# Patient Record
Sex: Female | Born: 1955 | Race: White | Hispanic: No | Marital: Married | Smoking: Former smoker
Health system: Southern US, Community
[De-identification: ages and names within clinical notes are randomized; demographics above are authoritative.]

## PROBLEM LIST (undated history)

## (undated) DIAGNOSIS — I10 Essential (primary) hypertension: Secondary | ICD-10-CM

## (undated) HISTORY — DX: Essential (primary) hypertension: I10

---

## 2001-09-23 ENCOUNTER — Other Ambulatory Visit: Admission: RE | Admit: 2001-09-23 | Discharge: 2001-09-23 | Payer: Self-pay | Admitting: Unknown Physician Specialty

## 2021-09-15 NOTE — Progress Notes (Signed)
?Cardiology Office Note:   ? ?Date:  09/19/2021  ? ?ID:  Jill Travis, DOB 1956/02/04, MRN [id number] ? ?PCP:  Lennox Pippinslark, Rhonda K, NP  ?Cardiologist:  None   ? ?Referring MD: Lennox Pippinslark, Rhonda K, NP  ? ?No chief complaint on file. ? ? ?History of Present Illness:   ? ?Jill Travis is a 66 y.o. female with a hx of hypertension, morbid obesity, and anxiety here today for the evaluation of leaky heart valve.  ? ?Today, she is doing well. She was told she had a leaking heart valve about 2 years ago. The valve was found by Dr. Michel SanteeFavero and Legrand PittsWilliam Shough, [place] in [place] via ultrasound due to her family history of heart disease and shortness of breath. Her mother had diabetes, COPD, and kidney failure with 6 stents. Her shortness of breath started about 2 years ago and has been progressively worsening. Reportedly, she also underwent stress tests for her shortness of breath. Minimal exertion like walking causes her to become winded. She also had L arm pain that she was told was not related to her heart. Currently, the pain is not as bad as it used to be. She endorses constant fatigue. She uses Ozempic and lost 33 lbs since February. She reports her diet is good but she has difficulty exercising due to the shortness of breath constant fatigue. She has performed a sleep study in the past and was told she does not need a CPAP machine. Recently, a nodule was found on her thyroid that is being monitored. She denies any palpitations, chest pain, lightheadedness, headaches, syncope, orthopnea, PND, or lower extremity edema. ? ?Past Medical History:  ?Diagnosis Date  ? Essential hypertension 09/19/2021  ? Fatigue 09/19/2021  ? Hypertension   ? Shortness of breath 09/19/2021  ? ? ?History reviewed. No pertinent surgical history. ? ?Current Medications: ?Current Meds  ?Medication Sig  ? aspirin 325 MG EC tablet Take 1 tablet by mouth daily.  ? Calcium-Magnesium-Zinc 333-133-5 MG TABS Take 1 tablet by mouth daily.  ? metoprolol  tartrate (LOPRESSOR) 100 MG tablet TAKE 1 TABLET 2 HOURS PRIOR TO CT  ? OZEMPIC, 1 MG/DOSE, 4 MG/3ML SOPN Inject 1 mg into the skin once a week.  ? triamcinolone ointment (KENALOG) 0.1 % PLEASE SEE ATTACHED FOR DETAILED DIRECTIONS  ? valsartan-hydrochlorothiazide (DIOVAN-HCT) 160-12.5 MG tablet Take 1 tablet by mouth daily.  ? venlafaxine (EFFEXOR) 75 MG tablet Take 75 mg by mouth daily.  ? Vitamin D, Ergocalciferol, (DRISDOL) 1.25 MG (50000 UNIT) CAPS capsule Take 50,000 Units by mouth once a week.  ?  ? ?Allergies:   Latex  ? ?Social History  ? ?Socioeconomic History  ? Marital status: Married  ?  Spouse name: Not on file  ? Number of children: Not on file  ? Years of education: Not on file  ? Highest education level: Not on file  ?Occupational History  ? Not on file  ?Tobacco Use  ? Smoking status: Former  ?  Types: Cigarettes  ?  Quit date: 241981  ?  Years since quitting: 42.3  ? Smokeless tobacco: Never  ?Substance and Sexual Activity  ? Alcohol use: Not Currently  ? Drug use: Never  ? Sexual activity: Not on file  ?Other Topics Concern  ? Not on file  ?Social History Narrative  ? Not on file  ? ?Social Determinants of Health  ? ?Financial Resource Strain: Low Risk   ? Difficulty of Paying Living Expenses: Not hard at all  ?  Food Insecurity: No Food Insecurity  ? Worried About Programme researcher, broadcasting/film/video in the Last Year: Never true  ? Ran Out of Food in the Last Year: Never true  ?Transportation Needs: No Transportation Needs  ? Lack of Transportation (Medical): No  ? Lack of Transportation (Non-Medical): No  ?Physical Activity: Inactive  ? Days of Exercise per Week: 0 days  ? Minutes of Exercise per Session: 0 min  ?Stress: Not on file  ?Social Connections: Not on file  ?  ? ?Family History: ?The patient's family history includes Asthma in her mother; COPD in her mother; Cancer in her maternal grandmother; Diabetes in her mother; Heart attack in her father and mother; Hyperlipidemia in her mother; Kidney disease in her  mother; Parkinson's disease in her father; Stroke in her maternal grandmother; Thyroid disease in her mother. ? ?ROS:   ?Please see the history of present illness.    ?(+) Shortness of breath ?(+) L arm pain ?(+) Fatigue/Malaise ?All other systems reviewed and negative.  ? ?EKGs/Labs/Other Studies Reviewed:   ? ?The following studies were reviewed today: ?No prior cardiovascular studies available ? ?EKG:   ?09/19/21: Sinus rhythm rate 91 bpm ? ?Recent Labs: ?No results found for requested labs within last 8760 hours.  ? ?Recent Lipid Panel ?No results found for: CHOL, TRIG, HDL, CHOLHDL, VLDL, LDLCALC, LDLDIRECT ? ? ?Physical Exam:   ?VS:  BP 100/70 (BP Location: Right Arm, Patient Position: Sitting, Cuff Size: Large)   Pulse 91   Ht 5\' 7"  (1.702 m)   Wt 217 lb (98.4 kg)   BMI 33.99 kg/m?  , BMI Body mass index is 33.99 kg/m?. ?GENERAL:  Well appearing ?HEENT: Pupils equal round and reactive, fundi not visualized, oral mucosa unremarkable ?NECK:  No jugular venous distention, waveform within normal limits, carotid upstroke brisk and symmetric, no bruits, no thyromegaly ?LYMPHATICS:  No cervical adenopathy ?LUNGS:  Clear to auscultation bilaterally ?HEART:  RRR.  PMI not displaced or sustained,S1 and S2 within normal limits, no S3, no S4, no clicks, no rubs, no murmurs ?ABD:  Flat, positive bowel sounds normal in frequency in pitch, no bruits, no rebound, no guarding, no midline pulsatile mass, no hepatomegaly, no splenomegaly ?EXT:  2 plus pulses throughout, no edema, no cyanosis no clubbing ?SKIN:  No rashes no nodules ?NEURO:  Cranial nerves II through XII grossly intact, motor grossly intact throughout ?PSYCH:  Cognitively intact, oriented to person place and time ? ?ASSESSMENT:   ? ?1. DOE (dyspnea on exertion)   ?2. Shortness of breath   ?3. Essential hypertension   ?4. Hypertensive heart disease without heart failure   ? ?PLAN:   ? ?Shortness of breath ?She gets very short of breath with exertion over the  last couple years.  This limits her ability to exercise.  She has no exertional chest pain.  She does have a strong family history of heart disease.  She also reports being told that one of her heart valves was leaking about a year and a half ago.  She does not have any evidence of heart failure on exam.  We will repeat her echocardiogram.  We will also get a coronary CTA to assess for obstructive coronary disease.  She previously had stress test but does not think that these are accurate.  Continue working on weight loss with Ozempic, diet and exercise.  ? ?Fatigue ?She denies symptoms of OSA and reports having a prior sleep study.  Blood counts remarkable with no evidence of  anemia. Thyroid function and electrolytes were within normal limits.  I suspect that this is not cardiac in etiology. ? ? ?Medication Adjustments/Labs and Tests Ordered: ?Current medicines are reviewed at length with the patient today.  Concerns regarding medicines are outlined above.  ?Orders Placed This Encounter  ?Procedures  ? CT CORONARY MORPH W/CTA COR W/SCORE W/CA W/CM &/OR WO/CM  ? Basic metabolic panel  ? EKG 12-Lead  ? ECHOCARDIOGRAM COMPLETE  ? ?Meds ordered this encounter  ?Medications  ? metoprolol tartrate (LOPRESSOR) 100 MG tablet  ?  Sig: TAKE 1 TABLET 2 HOURS PRIOR TO CT  ?  Dispense:  1 tablet  ?  Refill:  0  ? ?Disposition: FU with APP in 4-6 weeks ? ?I,Mykaella Javier,acting as a scribe for Chilton Si, MD.,have documented all relevant documentation on the behalf of Chilton Si, MD,as directed by  Chilton Si, MD while in the presence of Chilton Si, MD. ? ?I, Janari Yamada C. Duke Salvia, MD have reviewed all documentation for this visit.  The documentation of the exam, diagnosis, procedures, and orders on 09/19/2021 are all accurate and complete. ? ? ? ?Signed, ?Chilton Si, MD  ?09/19/2021 2:48 PM    ?[facility] ?

## 2021-09-19 ENCOUNTER — Encounter (HOSPITAL_BASED_OUTPATIENT_CLINIC_OR_DEPARTMENT_OTHER): Payer: Self-pay | Admitting: Cardiovascular Disease

## 2021-09-19 ENCOUNTER — Ambulatory Visit (INDEPENDENT_AMBULATORY_CARE_PROVIDER_SITE_OTHER): Payer: Medicare Other | Admitting: Cardiovascular Disease

## 2021-09-19 VITALS — BP 100/70 | HR 91 | Ht 67.0 in | Wt 217.0 lb

## 2021-09-19 DIAGNOSIS — R0602 Shortness of breath: Secondary | ICD-10-CM | POA: Diagnosis not present

## 2021-09-19 DIAGNOSIS — R0609 Other forms of dyspnea: Secondary | ICD-10-CM

## 2021-09-19 DIAGNOSIS — R5383 Other fatigue: Secondary | ICD-10-CM

## 2021-09-19 DIAGNOSIS — I10 Essential (primary) hypertension: Secondary | ICD-10-CM

## 2021-09-19 DIAGNOSIS — I119 Hypertensive heart disease without heart failure: Secondary | ICD-10-CM | POA: Diagnosis not present

## 2021-09-19 HISTORY — DX: Shortness of breath: R06.02

## 2021-09-19 HISTORY — DX: Essential (primary) hypertension: I10

## 2021-09-19 HISTORY — DX: Other fatigue: R53.83

## 2021-09-19 MED ORDER — METOPROLOL TARTRATE 100 MG PO TABS: ORAL_TABLET | ORAL | 0 refills | Status: DC

## 2021-09-19 NOTE — Assessment & Plan Note (Signed)
She denies symptoms of OSA and reports having a prior sleep study.  Blood counts remarkable with no evidence of anemia. Thyroid function and electrolytes were within normal limits.  I suspect that this is not cardiac in etiology. ?

## 2021-09-19 NOTE — Patient Instructions (Addendum)
Medication Instructions:  ?TAKE METOPROLOL 100 MG 2 HOURS PRIOR TO CT  ?DO NOT TAKE YOU VALSARTAN HCT MORNING OF  ? ?*If you need a refill on your cardiac medications before your next appointment, please call your pharmacy* ? ?Lab Work: ?BMET 1 WEEK PRIOR TO CT  ? ?If you have labs (blood work) drawn today and your tests are completely normal, you will receive your results only by: ?MyChart Message (if you have MyChart) OR ?A paper copy in the mail ?If you have any lab test that is abnormal or we need to change your treatment, we will call you to review the results. ? ?Testing/Procedures: ?Your physician has requested that you have cardiac CT. Cardiac computed tomography (CT) is a painless test that uses an x-ray machine to take clear, detailed pictures of your heart. For further information please visit [email or web address] Please follow instruction sheet as given. ?THE OFFICE WILL CALL YOU TO SCHEDULE ONCE YOUR INSURANCE HAS BEEN REVIEWED  ? ?Your physician has requested that you have an echocardiogram. Echocardiography is a painless test that uses sound waves to create images of your heart. It provides your doctor with information about the size and shape of your heart and how well your heart?s chambers and valves are working. This procedure takes approximately one hour. There are no restrictions for this procedure. ? ?Follow-Up: ?At [facility], you and your health needs are our priority.  As part of our continuing mission to provide you with exceptional heart care, we have created designated Provider Care Teams.  These Care Teams include your primary Cardiologist (physician) and Advanced Practice Providers (APPs -  Physician Assistants and Nurse Practitioners) who all work together to provide you with the care you need, when you need it. ? ?We recommend signing up for the patient portal called "MyChart".  Sign up information is provided on this After Visit Summary.  MyChart is used to connect with patients  for Virtual Visits (Telemedicine).  Patients are able to view lab/test results, encounter notes, upcoming appointments, etc.  Non-urgent messages can be sent to your provider as well.   ?To learn more about what you can do with MyChart, go to [email or web address].   ? ?Your next appointment:   ?4-6 WEEKS WITH CAITLIN W NP  ? ?Other Instructions ? ? ? ?Your cardiac CT will be scheduled at one of the below locations:  ? ?[facility] ?[address] ?[place], [place] [address] ?[phone] ? ?OR ? ?[facility] ?[address] ?[address] ?[place], [place] [address] ?[phone] ? ?If scheduled at [facility], please arrive at the [facility] and Children's Entrance (Entrance C2) of [facility] 30 minutes prior to test start time. ?You can use the FREE valet parking offered at entrance C (encouraged to control the heart rate for the test)  ?Proceed to the [facility] Radiology Department (first floor) to check-in and test prep. ? ?All radiology patients and guests should use entrance C2 at [facility], accessed from [facility], even though the hospital's physical address listed is [address] ? ? ? ?If scheduled at [facility], please arrive 15 mins early for check-in and test prep. ? ?Please follow these instructions carefully (unless otherwise directed): ? ?Hold all erectile dysfunction medications at least 3 days (72 hrs) prior to test. ? ?On the Night Before the Test: ?Be sure to Drink plenty of water. ?Do not consume any caffeinated/decaffeinated beverages or chocolate 12  hours prior to your test. ?Do not take any antihistamines 12 hours prior to your test. ?If the patient has contrast allergy: ?Patient will need a prescription for Prednisone and very clear instructions (as follows): ?Prednisone 50 mg - take 13 hours prior to test ?Take another Prednisone 50 mg 7 hours prior to test ?Take  another Prednisone 50 mg 1 hour prior to test ?Take Benadryl 50 mg 1 hour prior to test ?Patient must complete all four doses of above prophylactic medications. ?Patient will need a ride after test due to Benadryl. ? ?On the Day of the Test: ?Drink plenty of water until 1 hour prior to the test. ?Do not eat any food 4 hours prior to the test. ?You may take your regular medications prior to the test.  ?Take metoprolol (Lopressor) two hours prior to test. ?HOLD Furosemide/Hydrochlorothiazide morning of the test. ?FEMALES- please wear underwire-free bra if available, avoid dresses & tight clothing ? ?After the Test: ?Drink plenty of water. ?After receiving IV contrast, you may experience a mild flushed feeling. This is normal. ?On occasion, you may experience a mild rash up to 24 hours after the test. This is not dangerous. If this occurs, you can take Benadryl 25 mg and increase your fluid intake. ?If you experience trouble breathing, this can be serious. If it is severe call 911 IMMEDIATELY. If it is mild, please call our office. ?If you take any of these medications: Glipizide/Metformin, Avandament, Glucavance, please do not take 48 hours after completing test unless otherwise instructed. ? ?We will call to schedule your test 2-4 weeks out understanding that some insurance companies will need an authorization prior to the service being performed.  ? ?For non-scheduling related questions, please contact the cardiac imaging nurse navigator should you have any questions/concerns: ?Rockwell Alexandria, Cardiac Imaging Nurse Navigator ?Larey Brick, Cardiac Imaging Nurse Navigator ?[facility] ?Direct Office Dial: [phone]  ? ?For scheduling needs, including cancellations and rescheduling, please call [place], [phone]. ? ?Cardiac CT Angiogram ?A cardiac CT angiogram is a procedure to look at the heart and the area around the heart. It may be done to help find the cause of chest pains or  other symptoms of heart disease. During this procedure, a substance called contrast dye is injected into the blood vessels in the area to be checked. A large X-ray machine, called a CT scanner, then takes detailed pictures of the heart and the surrounding area. The procedure is also sometimes called a coronary CT angiogram, coronary artery scanning, or CTA. ?A cardiac CT angiogram allows the health care provider to see how well blood is flowing to and from the heart. The health care provider will be able to see if there are any problems, such as: ?Blockage or narrowing of the coronary arteries in the heart. ?Fluid around the heart. ?Signs of weakness or disease in the muscles, valves, and tissues of the heart. ?Tell a health care provider about: ?Any allergies you have. This is especially important if you have had a previous allergic reaction to contrast dye. ?All medicines you are taking, including vitamins, herbs, eye drops, creams, and over-the-counter medicines. ?Any blood disorders you have. ?Any surgeries you have had. ?Any medical conditions you have. ?Whether you are pregnant or may be pregnant. ?Any anxiety disorders, chronic pain, or other conditions you have that may increase your stress or prevent you from lying still. ?What are the risks? ?Generally, this is a safe procedure. However, problems may occur, including: ?Bleeding. ?  Infection. ?Allergic reactions to medicines or dyes. ?Damage to other structures or organs. ?Kidney damage from the contrast dye that is used. ?Increased risk of cancer from radiation exposure. This risk is low. Talk with your health care provider about: ?The risks and benefits of testing. ?How you can receive the lowest dose of radiation. ?What happens before the procedure? ?Wear comfortable clothing and remove any jewelry, glasses, dentures, and hearing aids. ?Follow instructions from your health care provider about eating and drinking. This may include: ?For 12 hours before the  procedure -- avoid caffeine. This includes tea, coffee, soda, energy drinks, and diet pills. Drink plenty of water or other fluids that do not have caffeine in them. Being well hydrated can prevent complicati

## 2021-09-19 NOTE — Assessment & Plan Note (Addendum)
She gets very short of breath with exertion over the last couple years.  This limits her ability to exercise.  She has no exertional chest pain.  She does have a strong family history of heart disease.  She also reports being told that one of her heart valves was leaking about a year and a half ago.  She does not have any evidence of heart failure on exam.  We will repeat her echocardiogram.  We will also get a coronary CTA to assess for obstructive coronary disease.  She previously had stress test but does not think that these are accurate.  Continue working on weight loss with Ozempic, diet and exercise.  ?

## 2021-09-21 ENCOUNTER — Encounter (HOSPITAL_BASED_OUTPATIENT_CLINIC_OR_DEPARTMENT_OTHER): Payer: Self-pay

## 2021-09-26 ENCOUNTER — Telehealth: Payer: Self-pay | Admitting: Cardiovascular Disease

## 2021-09-26 NOTE — Telephone Encounter (Signed)
RN advised patient on labs!  ?

## 2021-09-26 NOTE — Telephone Encounter (Signed)
Patient is calling stating she plans to go to [facility] on Monday 05/08 for her CT. Wants to know if that will be enough time for the results to be received.  ?

## 2021-10-04 LAB — BASIC METABOLIC PANEL
BUN/Creatinine Ratio: 24 (ref 12–28)
BUN: 23 mg/dL (ref 8–27)
CO2: 29 mmol/L (ref 20–29)
Calcium: 9.6 mg/dL (ref 8.7–10.3)
Chloride: 98 mmol/L (ref 96–106)
Creatinine, Ser: 0.95 mg/dL (ref 0.57–1.00)
Glucose: 88 mg/dL (ref 70–99)
Potassium: 3.8 mmol/L (ref 3.5–5.2)
Sodium: 139 mmol/L (ref 134–144)
eGFR: 66 mL/min/{1.73_m2} (ref >59)

## 2021-10-06 ENCOUNTER — Telehealth (HOSPITAL_COMMUNITY): Payer: Self-pay | Admitting: *Deleted

## 2021-10-06 NOTE — Telephone Encounter (Signed)
Reaching out to patient to offer assistance regarding upcoming cardiac imaging study; pt verbalizes understanding of appt date/time, parking situation and where to check in, pre-test NPO status and medications ordered, and verified current allergies; name and call back number provided for further questions should they arise  Shanikia Kernodle RN Navigator Cardiac Imaging [facility] [phone] office [phone] cell  Patient to take 100mg metoprolol tartrate two hours prior to her cardiac CT scan.  She is aware to arrive 10:30am.  

## 2021-10-07 ENCOUNTER — Ambulatory Visit (HOSPITAL_COMMUNITY)
Admission: RE | Admit: 2021-10-07 | Discharge: 2021-10-07 | Disposition: A | Discharge status: Home / Self Care | Payer: Medicare Other | Source: Ambulatory Visit | Attending: Cardiovascular Disease | Admitting: Cardiovascular Disease

## 2021-10-07 ENCOUNTER — Ambulatory Visit (INDEPENDENT_AMBULATORY_CARE_PROVIDER_SITE_OTHER): Payer: Medicare Other

## 2021-10-07 DIAGNOSIS — I700 Atherosclerosis of aorta: Secondary | ICD-10-CM

## 2021-10-07 DIAGNOSIS — R0602 Shortness of breath: Secondary | ICD-10-CM | POA: Insufficient documentation

## 2021-10-07 DIAGNOSIS — R0609 Other forms of dyspnea: Secondary | ICD-10-CM | POA: Diagnosis not present

## 2021-10-07 DIAGNOSIS — I10 Essential (primary) hypertension: Secondary | ICD-10-CM

## 2021-10-07 DIAGNOSIS — I119 Hypertensive heart disease without heart failure: Secondary | ICD-10-CM | POA: Insufficient documentation

## 2021-10-07 LAB — ECHOCARDIOGRAM COMPLETE
AR max vel: 2.56 cm2
AV Area VTI: 2.33 cm2
AV Area mean vel: 2.44 cm2
AV Mean grad: 4 mmHg
AV Peak grad: 7.4 mmHg
Ao pk vel: 1.36 m/s
Area-P 1/2: 3.36 cm2
Calc EF: 66 %
Single Plane A2C EF: 61.1 %
Single Plane A4C EF: 70 %

## 2021-10-07 MED ORDER — NITROGLYCERIN 0.4 MG SL SUBL
SUBLINGUAL_TABLET | SUBLINGUAL | Status: AC
  Filled 2021-10-07: qty 2

## 2021-10-07 MED ORDER — IOHEXOL 350 MG/ML SOLN
100.0000 mL | Freq: Once | INTRAVENOUS | Status: AC | PRN
  Administered 2021-10-07: 100 mL via INTRAVENOUS

## 2021-10-07 MED ORDER — METOPROLOL TARTRATE 5 MG/5ML IV SOLN: 10.0000 mg | INTRAVENOUS | Status: DC | PRN

## 2021-10-07 MED ORDER — NITROGLYCERIN 0.4 MG SL SUBL
0.8000 mg | SUBLINGUAL_TABLET | Freq: Once | SUBLINGUAL | Status: AC
  Administered 2021-10-07: 0.8 mg via SUBLINGUAL

## 2021-10-07 MED ORDER — METOPROLOL TARTRATE 5 MG/5ML IV SOLN
INTRAVENOUS | Status: AC
  Administered 2021-10-07: 10 mg via INTRAVENOUS
  Filled 2021-10-07: qty 20

## 2021-10-17 ENCOUNTER — Telehealth (HOSPITAL_BASED_OUTPATIENT_CLINIC_OR_DEPARTMENT_OTHER): Payer: Self-pay | Admitting: *Deleted

## 2021-10-17 NOTE — Telephone Encounter (Signed)
Left message to call back  

## 2021-10-17 NOTE — Telephone Encounter (Signed)
-----   Message from Skeet Latch, MD sent at 10/16/2021 11:34 AM EDT ----- She has minimal plaque in the coronary arteries and in the aorta.  However, it is more than would be expected for age and gender.  This isn't causing shortness of breath but needs to be treated aggressively to prevent heart attacks or strokes in the future.  Recommend starting rosuvastatin 10 mg.  Repea lipids/CMP in 2-3 months.

## 2021-10-17 NOTE — Telephone Encounter (Signed)
-----   Message from Chilton Si, MD sent at 10/16/2021 11:34 AM EDT ----- Normal echo

## 2021-10-17 NOTE — Telephone Encounter (Signed)
Advised patient of results, does not want to start medication at this time will discuss at follow up in June

## 2021-10-18 NOTE — Telephone Encounter (Signed)
Noted. Will address at follow up.   Cayle Cordoba S Aubrielle Stroud, NP  

## 2021-10-28 ENCOUNTER — Encounter (HOSPITAL_BASED_OUTPATIENT_CLINIC_OR_DEPARTMENT_OTHER): Payer: Self-pay | Admitting: Family

## 2021-10-28 ENCOUNTER — Ambulatory Visit (INDEPENDENT_AMBULATORY_CARE_PROVIDER_SITE_OTHER): Payer: Medicare Other | Admitting: Family

## 2021-10-28 VITALS — BP 104/72 | HR 97 | Ht 67.0 in | Wt 210.7 lb

## 2021-10-28 DIAGNOSIS — I25118 Atherosclerotic heart disease of native coronary artery with other forms of angina pectoris: Secondary | ICD-10-CM

## 2021-10-28 DIAGNOSIS — E785 Hyperlipidemia, unspecified: Secondary | ICD-10-CM | POA: Diagnosis not present

## 2021-10-28 DIAGNOSIS — I10 Essential (primary) hypertension: Secondary | ICD-10-CM

## 2021-10-28 DIAGNOSIS — R0609 Other forms of dyspnea: Secondary | ICD-10-CM | POA: Diagnosis not present

## 2021-10-28 MED ORDER — ASPIRIN 81 MG PO TBEC: 81.0000 mg | DELAYED_RELEASE_TABLET | Freq: Every day | ORAL | 3 refills | Status: AC

## 2021-10-28 MED ORDER — ROSUVASTATIN CALCIUM 10 MG PO TABS: 10.0000 mg | ORAL_TABLET | Freq: Every day | ORAL | 11 refills | Status: DC

## 2021-10-28 NOTE — Progress Notes (Unsigned)
Office Visit    Patient Name: Jill Travis Date of Encounter: 10/28/2021  PCP:  Lennox Pippins, NP   [facility]  Cardiologist:  Chilton Si, MD *** Advanced Practice Provider:  No care team member to display Electrophysiologist:  None   Chief Complaint    Jill Travis is a 66 y.o. female with a hx of *** presents today for follow up after echocardiogram and CTA   Past Medical History    Past Medical History:  Diagnosis Date   Essential hypertension 09/19/2021   Fatigue 09/19/2021   Hypertension    Shortness of breath 09/19/2021   No past surgical history on file.  Allergies  Allergies  Allergen Reactions   Latex Itching and Rash    History of Present Illness    Jill Travis is a 66 y.o. female with a hx of *** last seen 09/19/21.  Family history notable for mother with DM 2, COPD, kidney failure 6/10.  Established with Dr. Duke Salvia 09/19/2021 after referral for leaky heart valve.  She was diagnosed 2 years prior by Dr. Michel Santee and Heywood Bene, PA in [place], [place] via ultrasound due to family history of coronary disease and shortness of breath.  She noted her shortness of breath started 2 years prior and was progressively worsening.  Recent Myoview per her report.  She was recommended for coronary CTA as well as echocardiogram.  Cardiac CTA 10/07/2021 coronary calcium score 22.8 with minimal mixed nonobstructive plaque, aortic atherosclerosis.  Echo 09/2021 LVEF 60 to 65%, no RWMA, normal diastolic function, RV normal size and function, normal PASP, trivial MR, aortic valve sclerosis without stenosis.  She presents today for follow-up. BP has been well controlled.   [facility]  Still feels tired a lot and "gives out easy".  Melanee Spry and doing work around  She had COVID really bad 3 years ago - was sick fo >1 moth and in and out of ht ehospital with dehydration.   This month   Very hesitant regarding mlutiple medications.   Works  2 day sper week  EKGs/Labs/Other Studies Reviewed:   The following studies were reviewed today: ***  EKG:  EKG is ordered today.  The ekg ordered today demonstrates ***  Recent Labs: 10/03/2021: BUN 23; Creatinine, Ser 0.95; Potassium 3.8; Sodium 139  Recent Lipid Panel No results found for: CHOL, TRIG, HDL, CHOLHDL, VLDL, LDLCALC, LDLDIRECT   Home Medications   Current Meds  Medication Sig   aspirin 325 MG EC tablet Take 1 tablet by mouth daily.   Calcium-Magnesium-Zinc 333-133-5 MG TABS Take 1 tablet by mouth daily.   OZEMPIC, 0.25 OR 0.5 MG/DOSE, 2 MG/3ML SOPN Inject 0.5 mg into the skin once a week.   triamcinolone ointment (KENALOG) 0.1 % PLEASE SEE ATTACHED FOR DETAILED DIRECTIONS   valsartan-hydrochlorothiazide (DIOVAN-HCT) 160-12.5 MG tablet Take 1 tablet by mouth daily.   venlafaxine (EFFEXOR) 75 MG tablet Take 75 mg by mouth daily.   Vitamin D, Ergocalciferol, (DRISDOL) 1.25 MG (50000 UNIT) CAPS capsule Take 50,000 Units by mouth once a week.     Review of Systems      All other systems reviewed and are otherwise negative except as noted above.  Physical Exam    VS:  BP 104/72 (BP Location: Right Arm, Patient Position: Sitting, Cuff Size: Large)   Pulse 97   Ht 5\' 7"  (1.702 m)   Wt 210 lb 11.2 oz (95.6 kg)   SpO2 97%   BMI  33.00 kg/m  , BMI Body mass index is 33 kg/m.  Wt Readings from Last 3 Encounters:  10/28/21 210 lb 11.2 oz (95.6 kg)  09/19/21 217 lb (98.4 kg)    GEN: Well nourished, well developed, in no acute distress. HEENT: normal. Neck: Supple, no JVD, carotid bruits, or masses. Cardiac: ***RRR, no murmurs, rubs, or gallops. No clubbing, cyanosis, edema.  ***Radials/PT 2+ and equal bilaterally.  Respiratory:  ***Respirations regular and unlabored, clear to auscultation bilaterally. GI: Soft, nontender, nondistended. MS: No deformity or atrophy. Skin: Warm and dry, no rash. Neuro:  Strength and sensation are intact. Psych: Normal  affect.  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  Signed, Alver Sorrow, NP 10/28/2021, 1:37 PM [facility]

## 2021-10-28 NOTE — Patient Instructions (Addendum)
Medication Instructions:  Your physician has recommended you make the following change in your medication:   CHANGE Aspirin to 81mg  daily  START Rosuvastatin one 10mg  tablet daily   *If you need a refill on your cardiac medications before your next appointment, please call your pharmacy*   Lab Work: Your physician recommends that you return for lab work in 2 months  for fasting lipid panel and CMP  Please return for Lab work. You may come to the...   [facility] (3rd floor) [address], [place], [address] [place]  Open: 8am-Noon and 1pm-4:30pm   [facility] at [facility] [phone]- Any location  **no appointments needed**   If you have labs (blood work) drawn today and your tests are completely normal, you will receive your results only by: [facility] (if you have MyChart) OR A paper copy in the mail If you have any lab test that is abnormal or we need to change your treatment, we will call you to review the results.   Testing/Procedures: None ordered today.   Follow-Up: At [facility], you and your health needs are our priority.  As part of our continuing mission to provide you with exceptional heart care, we have created designated Provider Care Teams.  These Care Teams include your primary Cardiologist (physician) and Advanced Practice Providers (APPs -  Physician Assistants and Nurse Practitioners) who all work together to provide you with the care you need, when you need it.  We recommend signing up for the patient portal called "MyChart".  Sign up information is provided on this After Visit Summary.  MyChart is used to connect with patients for Virtual Visits (Telemedicine).  Patients are able to view lab/test results, encounter notes, upcoming appointments, etc.  Non-urgent messages can be sent to your provider as well.   To learn more about what you can do with MyChart, go to [employer].     Your next appointment:   6 month(s)  The format for your next appointment:   In Person  Provider:   [facility], MD or [email or web address], NP    Other Instructions  Heart Healthy Diet Recommendations: A low-salt diet is recommended. Meats should be grilled, baked, or boiled. Avoid fried foods. Focus on lean protein sources like fish or chicken with vegetables and fruits. The American Heart Association is a Chilton Si!  American Heart Association Diet and Lifeystyle Recommendations    Exercise recommendations: The American Heart Association recommends 150 minutes of moderate intensity exercise weekly. Try 30 minutes of moderate intensity exercise 4-5 times per week. This could include walking, jogging, or swimming.  Important Information About Sugar

## 2022-02-02 ENCOUNTER — Encounter (HOSPITAL_BASED_OUTPATIENT_CLINIC_OR_DEPARTMENT_OTHER): Payer: Self-pay

## 2022-04-28 ENCOUNTER — Ambulatory Visit (HOSPITAL_BASED_OUTPATIENT_CLINIC_OR_DEPARTMENT_OTHER): Payer: Medicare Other | Admitting: Family

## 2022-05-31 ENCOUNTER — Ambulatory Visit (INDEPENDENT_AMBULATORY_CARE_PROVIDER_SITE_OTHER): Payer: Medicare Other | Admitting: Family

## 2022-05-31 ENCOUNTER — Encounter (HOSPITAL_BASED_OUTPATIENT_CLINIC_OR_DEPARTMENT_OTHER): Payer: Self-pay | Admitting: Family

## 2022-05-31 VITALS — BP 126/68 | HR 95 | Ht 67.0 in | Wt 205.0 lb

## 2022-05-31 DIAGNOSIS — I10 Essential (primary) hypertension: Secondary | ICD-10-CM

## 2022-05-31 DIAGNOSIS — I2510 Atherosclerotic heart disease of native coronary artery without angina pectoris: Secondary | ICD-10-CM

## 2022-05-31 DIAGNOSIS — Z6832 Body mass index (BMI) 32.0-32.9, adult: Secondary | ICD-10-CM

## 2022-05-31 DIAGNOSIS — E785 Hyperlipidemia, unspecified: Secondary | ICD-10-CM

## 2022-05-31 NOTE — Patient Instructions (Signed)
Medication Instructions:  Your physician has recommended you make the following change in your medication:   Take your Rosuvastatin in the morning   *If you need a refill on your cardiac medications before your next appointment, please call your pharmacy*   Lab Work: Your physician recommends that you return for lab work in 3 months for fasting lipid (cholesterol) panel and liver function tests  If you have labs (blood work) drawn today and your tests are completely normal, you will receive your results only by: [facility] (if you have MyChart) OR A paper copy in the mail If you have any lab test that is abnormal or we need to change your treatment, we will call you to review the results.   Testing/Procedures: None ordered today.   Follow-Up: At [facility], you and your health needs are our priority.  As part of our continuing mission to provide you with exceptional heart care, we have created designated Provider Care Teams.  These Care Teams include your primary Cardiologist (physician) and Advanced Practice Providers (APPs -  Physician Assistants and Nurse Practitioners) who all work together to provide you with the care you need, when you need it.  We recommend signing up for the patient portal called "MyChart".  Sign up information is provided on this After Visit Summary.  MyChart is used to connect with patients for Virtual Visits (Telemedicine).  Patients are able to view lab/test results, encounter notes, upcoming appointments, etc.  Non-urgent messages can be sent to your provider as well.   To learn more about what you can do with MyChart, go to [email or web address].    Your next appointment:   1 year(s)  The format for your next appointment:   In Person  Provider:   Skeet Latch, MD or Laurann Montana, NP    Other Instructions  Heart Healthy Diet Recommendations: A low-salt diet is recommended. Meats should be grilled, baked, or boiled. Avoid  fried foods. Focus on lean protein sources like fish or chicken with vegetables and fruits. The American Heart Association is a Microbiologist!  American Heart Association Diet and Lifeystyle Recommendations   Exercise recommendations: The American Heart Association recommends 150 minutes of moderate intensity exercise weekly. Try 30 minutes of moderate intensity exercise 4-5 times per week. This could include walking, jogging, or swimming.   Important Information About Sugar

## 2022-05-31 NOTE — Progress Notes (Signed)
Office Visit    Patient Name: Jill Travis Date of Encounter: 05/31/2022  PCP:  Frederic Jericho, NP   [place]  Cardiologist:  Skeet Latch, MD  Advanced Practice Provider:  No care team member to display Electrophysiologist:  None   Chief Complaint    Jill Travis is a 67 y.o. female with a hx of CAD, HLD, HTN, obesity, anxiety presents today for  follow up of CAD  Past Medical History    Past Medical History:  Diagnosis Date   Essential hypertension 09/19/2021   Fatigue 09/19/2021   Hypertension    Shortness of breath 09/19/2021   History reviewed. No pertinent surgical history.  Allergies  Allergies  Allergen Reactions   Latex Itching and Rash    History of Present Illness    Jill Travis is a 67 y.o. female with a hx of CAD, HLD, HTN, obesity, anxiety  last seen 10/28/21.  Family history notable for mother with DM 2, COPD, kidney failure with 6 stents.   Established with Dr. Oval Linsey 09/19/2021 after referral for leaky heart valve.  She was diagnosed 2 years prior by Dr. Elvina Mattes and Angela Adam, [place] in [place], [place] via ultrasound due to family history of coronary disease and shortness of breath.  She noted her shortness of breath started 2 years prior and was progressively worsening.  Recent Myoview per her report.  She was recommended for coronary CTA as well as echocardiogram.  Cardiac CTA 10/07/2021 coronary calcium score 22.8 with minimal mixed nonobstructive plaque, aortic atherosclerosis.  Echo 09/2021 LVEF 60 to 65%, no RWMA, normal diastolic function, RV normal size and function, normal PASP, trivial MR, aortic valve sclerosis without stenosis. Last seen 10/28/21 and Rosuvastatin added. 12/2021 LDL 86.   She presents today for follow-up. Pleasant lady who continues to work 2 days per week and enjoys spending time at [facility] in warmer months. Not exercising as much as pool is closed.  Dyspnea and energy level improved from  previous. Reports no chest pain, pressure, or tightness. No edema, orthopnea, PND. Reports no palpitations.  Presents today for follow up. She has lost 5 pounds since last office visit which she attributes to [place]. Notes often missing her Rosuvastatin as she was told to take at night.   EKGs/Labs/Other Studies Reviewed:   The following studies were reviewed today:  Cardiac CTA 10/07/21 FINDINGS: Quality: Good, HR 68   Coronary calcium score: The patient's coronary artery calcium score is 22.8, which places the patient in the 66th percentile.   Coronary arteries: Normal coronary origins.  Right dominance.   Right Coronary Artery: Dominant.  No disease.   Left Main Coronary Artery: Normal. Bifurcates into the LAD and LCx arteries.   Left Anterior Descending Coronary Artery: Anterior artery which is tortuous but reaches the apex. There is minimal mixed 1-24% ostial stenosis (CADRADS1).   Left Circumflex Artery: Lateral wall LCx artery without disease.   Aorta: Normal size, 32 mm at the mid ascending aorta (level of the PA bifurcation) measured double oblique. Aortic atherosclerosis. No dissection.   Aortic Valve: Trileaflet. No calcifications.   Other findings:   Normal pulmonary vein drainage into the left atrium.   Normal left atrial appendage without a thrombus.   Normal size of the pulmonary artery.   IMPRESSION: 1. Minimal mixed non-obstructive CAD, CADRADS = 1.   2. Coronary calcium score of 22.8. This was 66th percentile for age and sex matched control.  3. Normal coronary origin with right dominance.   4. Aortic atherosclerosis.   5. Continued cardiovascular risk factor modification is recommended.   Echo 10/07/21  1. Left ventricular ejection fraction, by estimation, is 60 to 65%. Left  ventricular ejection fraction by 3D volume is 64 %. The left ventricle has  normal function. The left ventricle has no regional wall motion  abnormalities. Left  ventricular diastolic   parameters were normal.   2. Right ventricular systolic function is normal. The right ventricular  size is normal. There is normal pulmonary artery systolic pressure. The  estimated right ventricular systolic pressure is 16.1 mmHg.   3. The mitral valve is normal in structure. Trivial mitral valve  regurgitation. No evidence of mitral stenosis.   4. The aortic valve is tricuspid. There is mild calcification of the  aortic valve. Aortic valve regurgitation is not visualized. Aortic valve  sclerosis is present, with no evidence of aortic valve stenosis.   5. The inferior vena cava is normal in size with greater than 50%  respiratory variability, suggesting right atrial pressure of 3 mmHg.   EKG:  No EKG today  Recent Labs: 10/03/2021: BUN 23; Creatinine, Ser 0.95; Potassium 3.8; Sodium 139  Recent Lipid Panel No results found for: "CHOL", "TRIG", "HDL", "CHOLHDL", "VLDL", "LDLCALC", "LDLDIRECT"   Home Medications   Current Meds  Medication Sig   aspirin EC 81 MG tablet Take 1 tablet (81 mg total) by mouth daily. Swallow whole.   Calcium-Magnesium-Zinc 333-133-5 MG TABS Take 1 tablet by mouth daily.   OZEMPIC, 0.25 OR 0.5 MG/DOSE, 2 MG/3ML SOPN Inject 0.5 mg into the skin once a week.   rosuvastatin (CRESTOR) 10 MG tablet Take 1 tablet (10 mg total) by mouth daily.   valsartan-hydrochlorothiazide (DIOVAN-HCT) 160-12.5 MG tablet Take 1 tablet by mouth daily.   venlafaxine (EFFEXOR) 75 MG tablet Take 75 mg by mouth daily.   Vitamin D, Ergocalciferol, (DRISDOL) 1.25 MG (50000 UNIT) CAPS capsule Take 50,000 Units by mouth once a week.     Review of Systems      All other systems reviewed and are otherwise negative except as noted above.  Physical Exam    VS:  BP 126/68   Pulse 95   Ht 5\' 7"  (1.702 m)   Wt 205 lb (93 kg)   BMI 32.11 kg/m  , BMI Body mass index is 32.11 kg/m.  Wt Readings from Last 3 Encounters:  05/31/22 205 lb (93 kg)  10/28/21 210 lb  11.2 oz (95.6 kg)  09/19/21 217 lb (98.4 kg)    GEN: Well nourished, well developed, in no acute distress. HEENT: normal. Neck: Supple, no JVD, carotid bruits, or masses. Cardiac: RRR, no murmurs, rubs, or gallops. No clubbing, cyanosis, edema.  Radials/PT 2+ and equal bilaterally.  Respiratory:  Respirations regular and unlabored, clear to auscultation bilaterally. GI: Soft, nontender, nondistended. MS: No deformity or atrophy. Skin: Warm and dry, no rash. Neuro:  Strength and sensation are intact. Psych: Normal affect.  Assessment & Plan    Nonobstructive CAD / HLD- Noted by cardiac CTA 09/2021. Stable with no anginal symptoms. No indication for ischemic evaluation. GDMT Rosuvatatin 10mg  QD, Aspirin EC 81mg  daily. She is not taking Rosuvastatin daily as she often misses evening dose. She will change to take in the morning and repeat FLP/LFT in 3 months. .Heart healthy diet and regular cardiovascular exercise encouraged.    HTN - BP well controlled. Continue current antihypertensive regimen.  Obesity - Weight loss  via diet and exercise encouraged. Discussed the impact being overweight would have on cardiovascular risk. On Ozempic per PCP.      Disposition: Follow up in 1 year(s) with Chilton Si, MD or APP.  Signed, Alver Sorrow, NP 05/31/2022, 11:50 AM [facility]

## 2022-11-03 ENCOUNTER — Other Ambulatory Visit (HOSPITAL_BASED_OUTPATIENT_CLINIC_OR_DEPARTMENT_OTHER): Payer: Self-pay | Admitting: Family

## 2022-11-03 DIAGNOSIS — E785 Hyperlipidemia, unspecified: Secondary | ICD-10-CM

## 2022-11-03 DIAGNOSIS — I25118 Atherosclerotic heart disease of native coronary artery with other forms of angina pectoris: Secondary | ICD-10-CM

## 2022-11-03 NOTE — Telephone Encounter (Signed)
Rx request sent to pharmacy.  

## 2022-11-21 ENCOUNTER — Encounter (HOSPITAL_BASED_OUTPATIENT_CLINIC_OR_DEPARTMENT_OTHER): Payer: Self-pay

## 2023-08-12 IMAGING — CT CT HEART MORP W/ CTA COR W/ SCORE W/ CA W/CM &/OR W/O CM
4 of 7 series · 8 of 20 positions shown, 9 images · IV contrast (omnipaque)
Comparison: None.

Addendum:
HISTORY: 65 yo female with dyspnea on exertion (LESYA)

EXAM:
Cardiac/Coronary CTA
TECHNIQUE: The patient was scanned on a Siemens Force scanner.
PROTOCOL: A 100 kV prospective scan was triggered in the descending thoracic
aorta at 111 HU's. Axial non-contrast 3 mm slices were carried out
through the heart. The data set was analyzed on a dedicated work
station and scored using the Agatson method. Gantry rotation speed
was 250 msecs and collimation was .6 mm. Beta blockade and 0.8 mg of
sl NTG was given. The 3D data set was reconstructed in 5% intervals
of the 35-75 % of the R-R cycle. Diastolic phases were analyzed on a
dedicated work station using MPR, MIP and VRT modes. The patient
received 100mL OMNIPAQUE IOHEXOL 350 MG/ML SOLN of contrast.

[Series 6: ts diast sharp · axial · 0.37mm/px · z∈[-208,-171]mm · 2 of 278 slices shown]
[im 93/278  lung]
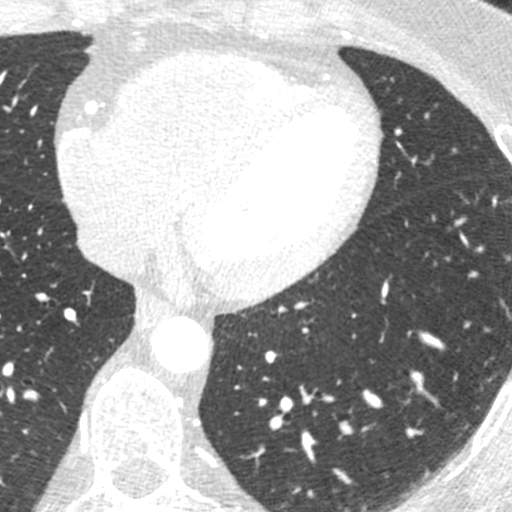
[im 185/278  lung]
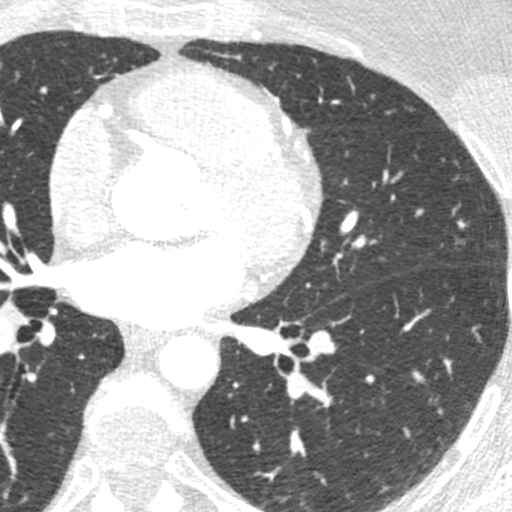

[Series 7: ts syst sharp · axial · 0.37mm/px · z∈[-208,-171]mm · 2 of 278 slices shown]
[im 93/278  lung]
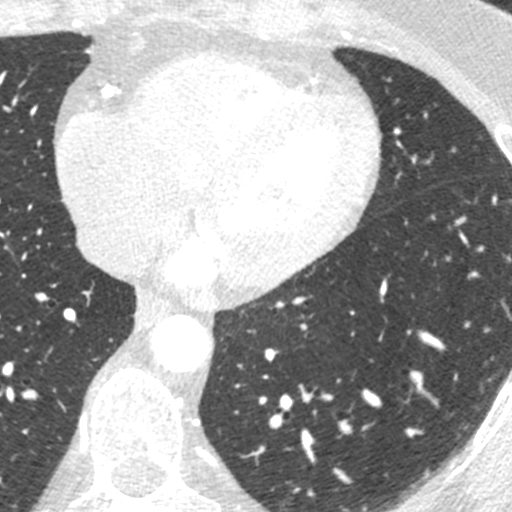
[im 185/278  lung]
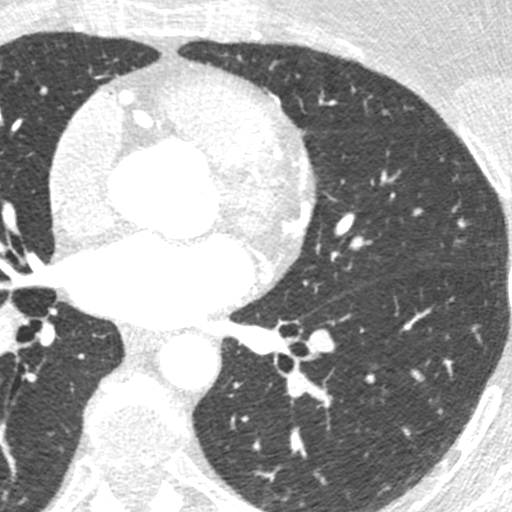

[Series 8: best diast · axial · 0.37mm/px · z∈[-208,-171]mm · 2 of 278 slices shown, 3 images]
[im 93/278  vessel]
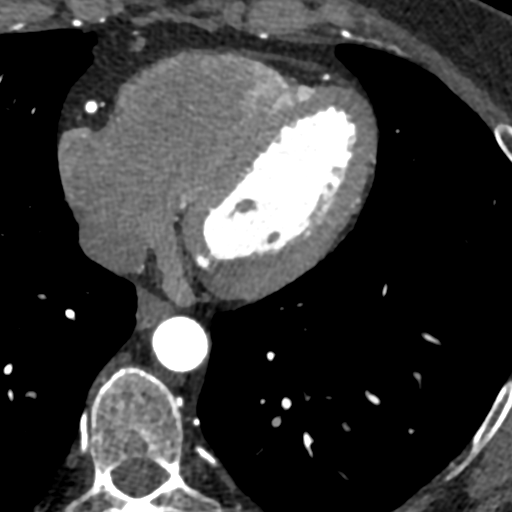
[im 93/278  lung]
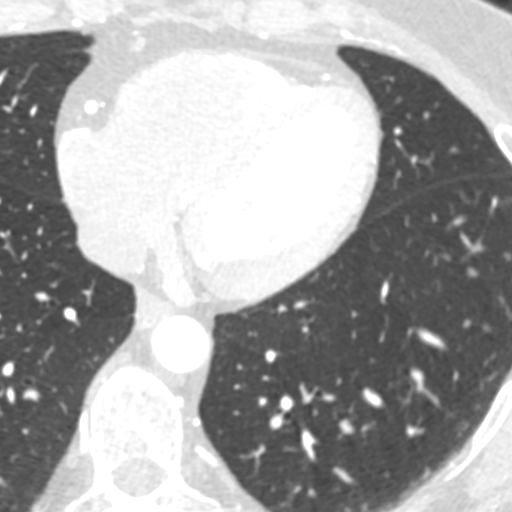
[im 185/278  vessel]
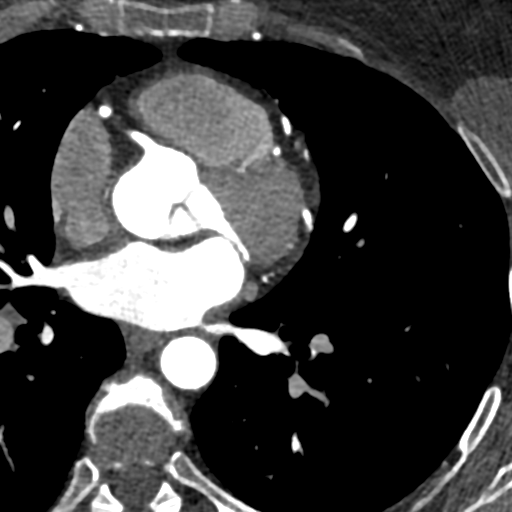

[Series 9: best syst · axial · 0.37mm/px · z∈[-208,-171]mm · 2 of 278 slices shown]
[im 93/278  vessel]
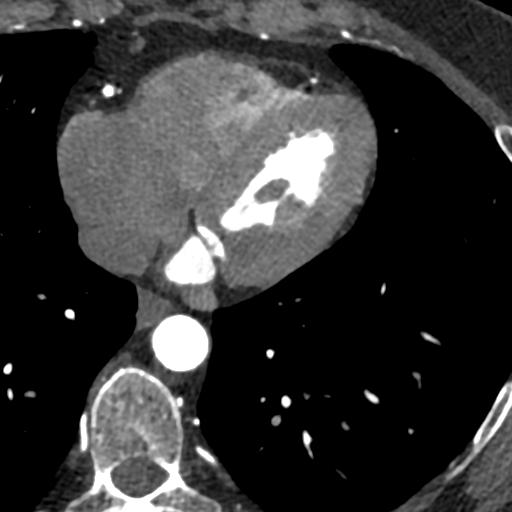
[im 185/278  vessel]
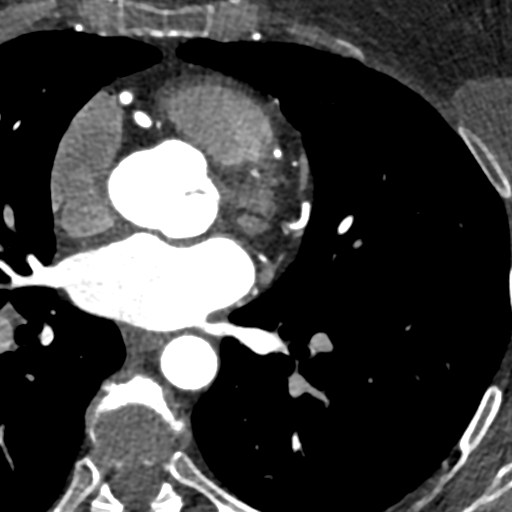

[8 of 20 positions shown; findings below may reference images not displayed]

FINDINGS: Quality: Good, HR 68

Coronary calcium score: The patient's coronary artery calcium score
is 22.8, which places the patient in the 66th percentile.

Coronary arteries: Normal coronary origins.  Right dominance.

Right Coronary Artery: Dominant.  No disease.

Left Main Coronary Artery: Normal. Bifurcates into the LAD and LCx
arteries.

Left Anterior Descending Coronary Artery: Anterior artery which is
tortuous but reaches the apex. There is minimal mixed 1-24% ostial
stenosis ([id number]).

Left Circumflex Artery: Lateral wall LCx artery without disease.

Aorta: Normal size, 32 mm at the mid ascending aorta (level of the
PA bifurcation) measured double oblique. Aortic atherosclerosis. No
dissection.

Aortic Valve: Trileaflet. No calcifications.

Other findings:

Normal pulmonary vein drainage into the left atrium.

Normal left atrial appendage without a thrombus.

Normal size of the pulmonary artery.
IMPRESSION: 1. Minimal mixed non-obstructive CAD, CADRADS = 1.

2. Coronary calcium score of 22.8. This was 66th percentile for age
and sex matched control.

3. Normal coronary origin with right dominance.

4. Aortic atherosclerosis.

5. Continued cardiovascular risk factor modification is recommended.

EXAM:
OVER-READ INTERPRETATION  PET-CT CHEST

The following report is an over-read performed by radiologist Dr.
Antonella Jeune [REDACTED] on 10/07/2021. This over-read
does not include interpretation of cardiac or coronary anatomy or
pathology. The cardiac CT interpretation by the cardiologist is to
be attached.
FINDINGS: No evidence for lymphadenopathy within the visualized mediastinum or
hilar regions.

The visualized lung parenchyma shows no suspicious pulmonary nodule
or mass. No focal airspace consolidation. No effusion.

Visualized portions of the upper abdomen are unremarkable.

No suspicious lytic or sclerotic osseous abnormality.
IMPRESSION: No acute or clinically significant extracardiac findings.

*** End of Addendum ***
FINDINGS: Quality: Good, HR 68

Coronary calcium score: The patient's coronary artery calcium score
is 22.8, which places the patient in the 66th percentile.

Coronary arteries: Normal coronary origins.  Right dominance.

Right Coronary Artery: Dominant.  No disease.

Left Main Coronary Artery: Normal. Bifurcates into the LAD and LCx
arteries.

Left Anterior Descending Coronary Artery: Anterior artery which is
tortuous but reaches the apex. There is minimal mixed 1-24% ostial
stenosis ([id number]).

Left Circumflex Artery: Lateral wall LCx artery without disease.

Aorta: Normal size, 32 mm at the mid ascending aorta (level of the
PA bifurcation) measured double oblique. Aortic atherosclerosis. No
dissection.

Aortic Valve: Trileaflet. No calcifications.

Other findings:

Normal pulmonary vein drainage into the left atrium.

Normal left atrial appendage without a thrombus.

Normal size of the pulmonary artery.
IMPRESSION: 1. Minimal mixed non-obstructive CAD, CADRADS = 1.

2. Coronary calcium score of 22.8. This was 66th percentile for age
and sex matched control.

3. Normal coronary origin with right dominance.

4. Aortic atherosclerosis.

5. Continued cardiovascular risk factor modification is recommended.

## 2023-12-14 ENCOUNTER — Other Ambulatory Visit (HOSPITAL_BASED_OUTPATIENT_CLINIC_OR_DEPARTMENT_OTHER): Payer: Self-pay | Admitting: Cardiovascular Disease

## 2023-12-14 DIAGNOSIS — I25118 Atherosclerotic heart disease of native coronary artery with other forms of angina pectoris: Secondary | ICD-10-CM

## 2023-12-14 DIAGNOSIS — E785 Hyperlipidemia, unspecified: Secondary | ICD-10-CM

## 2024-03-12 ENCOUNTER — Other Ambulatory Visit (HOSPITAL_BASED_OUTPATIENT_CLINIC_OR_DEPARTMENT_OTHER): Payer: Self-pay | Admitting: Cardiovascular Disease

## 2024-03-12 DIAGNOSIS — I25118 Atherosclerotic heart disease of native coronary artery with other forms of angina pectoris: Secondary | ICD-10-CM

## 2024-03-12 DIAGNOSIS — E785 Hyperlipidemia, unspecified: Secondary | ICD-10-CM

## 2024-04-13 ENCOUNTER — Other Ambulatory Visit (HOSPITAL_BASED_OUTPATIENT_CLINIC_OR_DEPARTMENT_OTHER): Payer: Self-pay | Admitting: Cardiovascular Disease

## 2024-04-13 DIAGNOSIS — I25118 Atherosclerotic heart disease of native coronary artery with other forms of angina pectoris: Secondary | ICD-10-CM

## 2024-04-13 DIAGNOSIS — E785 Hyperlipidemia, unspecified: Secondary | ICD-10-CM
